# Patient Record
Sex: Female | Born: 2000 | Race: White | Hispanic: No | Marital: Married
Health system: Midwestern US, Community
[De-identification: ages and names within clinical notes are randomized; demographics above are authoritative.]

## PROBLEM LIST (undated history)

## (undated) DIAGNOSIS — L309 Dermatitis, unspecified: Secondary | ICD-10-CM

## (undated) DIAGNOSIS — L509 Urticaria, unspecified: Secondary | ICD-10-CM

## (undated) HISTORY — DX: Dermatitis, unspecified: L30.9

## (undated) HISTORY — DX: Urticaria, unspecified: L50.9

---
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
## 2019-02-10 ENCOUNTER — Emergency Department (HOSPITAL_BASED_OUTPATIENT_CLINIC_OR_DEPARTMENT_OTHER)
Admission: EM | Admit: 2019-02-10 | Discharge: 2019-02-10 | Disposition: A | Discharge status: Home / Self Care | Payer: 59 | Attending: Emergency Medicine | Admitting: Emergency Medicine

## 2019-02-10 ENCOUNTER — Other Ambulatory Visit: Payer: Self-pay

## 2019-02-10 ENCOUNTER — Encounter (HOSPITAL_BASED_OUTPATIENT_CLINIC_OR_DEPARTMENT_OTHER): Payer: Self-pay | Admitting: Emergency Medicine

## 2019-02-10 ENCOUNTER — Emergency Department (HOSPITAL_BASED_OUTPATIENT_CLINIC_OR_DEPARTMENT_OTHER): Payer: 59

## 2019-02-10 DIAGNOSIS — R112 Nausea with vomiting, unspecified: Secondary | ICD-10-CM | POA: Insufficient documentation

## 2019-02-10 DIAGNOSIS — R1084 Generalized abdominal pain: Secondary | ICD-10-CM | POA: Insufficient documentation

## 2019-02-10 DIAGNOSIS — R197 Diarrhea, unspecified: Secondary | ICD-10-CM | POA: Diagnosis not present

## 2019-02-10 DIAGNOSIS — R109 Unspecified abdominal pain: Secondary | ICD-10-CM | POA: Diagnosis present

## 2019-02-10 LAB — COMPREHENSIVE METABOLIC PANEL
ALBUMIN: 4.5 g/dL (ref 3.5–5.0)
ALK PHOS: 73 U/L (ref 38–126)
ALT: 16 U/L (ref 0–44)
ANION GAP: 10 (ref 5–15)
AST: 21 U/L (ref 15–41)
BUN: 19 mg/dL (ref 6–20)
CALCIUM: 9.6 mg/dL (ref 8.9–10.3)
CO2: 22 mmol/L (ref 22–32)
Chloride: 103 mmol/L (ref 98–111)
Creatinine, Ser: 0.67 mg/dL (ref 0.44–1.00)
GFR calc Af Amer: >60 mL/min (ref >60)
GFR calc non Af Amer: >60 mL/min (ref >60)
GLUCOSE: 144 mg/dL — AB (ref 70–99)
POTASSIUM: 3.8 mmol/L (ref 3.5–5.1)
SODIUM: 135 mmol/L (ref 135–145)
Total Bilirubin: 0.7 mg/dL (ref 0.3–1.2)
Total Protein: 8.2 g/dL — ABNORMAL HIGH (ref 6.5–8.1)

## 2019-02-10 LAB — URINALYSIS, ROUTINE W REFLEX MICROSCOPIC
Bilirubin Urine: NEGATIVE
GLUCOSE, UA: NEGATIVE mg/dL
HGB URINE DIPSTICK: NEGATIVE
Ketones, ur: 15 mg/dL — AB
Leukocytes,Ua: NEGATIVE
Nitrite: NEGATIVE
Protein, ur: NEGATIVE mg/dL
SPECIFIC GRAVITY, URINE: 1.01 (ref 1.005–1.030)
pH: >9 — ABNORMAL HIGH (ref 5.0–8.0)

## 2019-02-10 LAB — CBC
HCT: 41.9 % (ref 36.0–46.0)
HEMOGLOBIN: 13.5 g/dL (ref 12.0–15.0)
MCH: 29 pg (ref 26.0–34.0)
MCHC: 32.2 g/dL (ref 30.0–36.0)
MCV: 89.9 fL (ref 80.0–100.0)
Platelets: 265 10*3/uL (ref 150–400)
RBC: 4.66 MIL/uL (ref 3.87–5.11)
RDW: 12 % (ref 11.5–15.5)
WBC: 11.6 10*3/uL — ABNORMAL HIGH (ref 4.0–10.5)
nRBC: 0 % (ref 0.0–0.2)

## 2019-02-10 LAB — LIPASE, BLOOD: Lipase: 32 U/L (ref 11–51)

## 2019-02-10 LAB — PREGNANCY, URINE: Preg Test, Ur: NEGATIVE

## 2019-02-10 MED ORDER — ONDANSETRON HCL 4 MG/2ML IJ SOLN
4.0000 mg | Freq: Once | INTRAMUSCULAR | Status: AC
  Administered 2019-02-10: 4 mg via INTRAVENOUS
  Filled 2019-02-10: qty 2

## 2019-02-10 MED ORDER — ONDANSETRON HCL 4 MG/2ML IJ SOLN
INTRAMUSCULAR | Status: AC
  Administered 2019-02-10: 4 mg via INTRAVENOUS
  Filled 2019-02-10: qty 2

## 2019-02-10 MED ORDER — IOPAMIDOL (ISOVUE-300) INJECTION 61%
100.0000 mL | Freq: Once | INTRAVENOUS | Status: AC | PRN
  Administered 2019-02-10: 100 mL via INTRAVENOUS

## 2019-02-10 MED ORDER — ONDANSETRON HCL 4 MG/2ML IJ SOLN
4.0000 mg | Freq: Once | INTRAMUSCULAR | Status: AC | PRN
  Administered 2019-02-10: 4 mg via INTRAVENOUS

## 2019-02-10 MED ORDER — SODIUM CHLORIDE 0.9 % IV BOLUS
1000.0000 mL | Freq: Once | INTRAVENOUS | Status: AC
  Administered 2019-02-10: 1000 mL via INTRAVENOUS

## 2019-02-10 MED ORDER — LACTATED RINGERS IV BOLUS
1000.0000 mL | Freq: Once | INTRAVENOUS | Status: AC
  Administered 2019-02-10: 1000 mL via INTRAVENOUS

## 2019-02-10 MED ORDER — IOPAMIDOL (ISOVUE-300) INJECTION 61%
30.0000 mL | Freq: Once | INTRAVENOUS | Status: AC | PRN
  Administered 2019-02-10: 15 mL via ORAL

## 2019-02-10 MED ORDER — KETOROLAC TROMETHAMINE 15 MG/ML IJ SOLN
15.0000 mg | Freq: Once | INTRAMUSCULAR | Status: AC
  Administered 2019-02-10: 15 mg via INTRAVENOUS
  Filled 2019-02-10: qty 1

## 2019-02-10 MED ORDER — ONDANSETRON 4 MG PO TBDP: 4.0000 mg | ORAL_TABLET | Freq: Three times a day (TID) | ORAL | 0 refills | Status: AC | PRN

## 2019-02-10 MED FILL — ONDANSETRON ODT 4 MG TABLET: 4 | 3 days supply | Qty: 8 | Fill #0

## 2019-02-10 NOTE — ED Provider Notes (Signed)
[facility] EMERGENCY DEPARTMENT Provider Note   CSN: [id number] Arrival date & time: 02/10/19  0762    History   Chief Complaint Chief Complaint  Patient presents with  . Emesis    HPI Rachel Robinson is a 18 y.o. female.     The history is provided by the patient. No language interpreter was used.  Emesis   Rachel Robinson is a 18 y.o. female who presents to the Emergency Department complaining of vomiting abdominal pain. She presents to the emergency department for evaluation of vomiting, diarrhea and abdominal pain that began around midnight. She reports numerous episodes of vomiting, diarrhea with generalized abdominal pain, greatest over her epigastric. On Friday she had similar episode and symptoms that resolved without intervention. She was feeling well and between episodes. No known bad food exposures. 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 She denies any fevers, dysuria, vaginal discharge. She takes no medications and has no medical problems. She did have an IUD placed one month ago. She has experienced light vaginal bleeding since IUD placement. She is not currently sexually active. History reviewed. No pertinent past medical history.  There are no active problems to display for this patient.   History reviewed. No pertinent surgical history.   OB History   No obstetric history on file.      Home Medications    Prior to Admission medications   Medication Sig Start Date End Date Taking? Authorizing Provider  ondansetron (ZOFRAN ODT) 4 MG disintegrating tablet Take 1 tablet (4 mg total) by mouth every 8 (eight) hours as needed for nausea or vomiting. 02/10/19   Tilden Fossa, MD    Family History No family history on file.  Social History Social History   Tobacco Use  . Smoking status: Never Smoker  . Smokeless tobacco: Never Used  Substance Use Topics  . Alcohol use: No  . Drug use: Not on file     Allergies     Keflet [cephalexin]   Review of Systems Review of Systems  Gastrointestinal: Positive for vomiting.  All other systems reviewed and are negative.    Physical Exam Updated Vital Signs BP 106/60   Pulse 99   Temp 99.3 F (37.4 C) (Oral)   Resp 16   Ht 5\' 1"  (1.549 m)   Wt 56.7 kg   SpO2 99%   BMI 23.62 kg/m   Physical Exam Vitals signs and nursing note reviewed.  Constitutional:      Appearance: She is well-developed.  HENT:     Head: Normocephalic and atraumatic.  Cardiovascular:     Rate and Rhythm: Regular rhythm.     Heart sounds: No murmur.     Comments: Tachycardic Pulmonary:     Effort: Pulmonary effort is normal. No respiratory distress.     Breath sounds: Normal breath sounds.  Abdominal:     Palpations: Abdomen is soft.     Tenderness: There is no guarding or rebound.     Comments: Moderate generalized abdominal tenderness.  Musculoskeletal:        General: No tenderness.  Skin:    General: Skin is warm and dry.  Neurological:     Mental Status: She is alert and oriented to person, place, and time.  Psychiatric:        Behavior: Behavior normal.      ED Treatments / Results  Labs (all labs ordered are listed, but only abnormal results are displayed) Labs Reviewed  COMPREHENSIVE METABOLIC PANEL - Abnormal; Notable for the following components:      Result Value   Glucose, Bld 144 (*)    Total Protein 8.2 (*)    All other components within normal limits  CBC - Abnormal; Notable for the following components:   WBC 11.6 (*)    All other components within normal limits  URINALYSIS, ROUTINE W REFLEX MICROSCOPIC - Abnormal; Notable for the following components:   pH >9.0 (*)    Ketones, ur 15 (*)    All other components within normal limits  LIPASE, BLOOD  PREGNANCY, URINE    EKG None  Radiology Ct Abdomen Pelvis W Contrast  Result Date: 02/10/2019 CLINICAL DATA:  Generalized abdominal pain. EXAM: CT ABDOMEN AND PELVIS WITH CONTRAST  TECHNIQUE: Multidetector CT imaging of the abdomen and pelvis was performed using the standard protocol following bolus administration of intravenous contrast. CONTRAST:  63mL ISOVUE-300 IOPAMIDOL (ISOVUE-300) INJECTION 61% orally, ISOVUE-300 IOPAMIDOL (ISOVUE-300) INJECTION 61% intravenously COMPARISON:  None. FINDINGS: Lower chest: No acute abnormality. Hepatobiliary: No focal liver abnormality is seen. No gallstones, gallbladder wall thickening, or biliary dilatation. Pancreas: Unremarkable. No pancreatic ductal dilatation or surrounding inflammatory changes. Spleen: Normal in size without focal abnormality. Adrenals/Urinary Tract: Adrenal glands appear normal. Left renal cysts are noted. No hydronephrosis or renal obstruction is noted. No renal or ureteral calculi are noted. Urinary bladder is unremarkable. Stomach/Bowel: Stomach is within normal limits. Appendix appears normal. No evidence of bowel wall thickening, distention, or inflammatory changes. Vascular/Lymphatic: No significant vascular findings are present. No enlarged abdominal or pelvic lymph nodes. Reproductive: Intrauterine device is noted. No adnexal abnormality is noted. Other: No abdominal wall hernia or abnormality. No abdominopelvic ascites. Musculoskeletal: No acute or significant osseous findings. IMPRESSION: No acute abnormality seen in the abdomen or pelvis. Electronically Signed   By: Lupita Raider, M.D.   On: 02/10/2019 09:35    Procedures Procedures (including critical care time)  Medications Ordered in ED Medications  ondansetron (ZOFRAN) injection 4 mg (4 mg Intravenous Given 02/10/19 0704)  sodium chloride 0.9 % bolus 1,000 mL ( Intravenous Stopped 02/10/19 0828)  iopamidol (ISOVUE-300) 61 % injection 100 mL (100 mLs Intravenous Contrast Given 02/10/19 0855)  iopamidol (ISOVUE-300) 61 % injection 30 mL (15 mLs Oral Contrast Given 02/10/19 0855)  ketorolac (TORADOL) 15 MG/ML injection 15 mg (15 mg Intravenous Given  02/10/19 0951)  ondansetron (ZOFRAN) injection 4 mg (4 mg Intravenous Given 02/10/19 0952)  lactated ringers bolus 1,000 mL (0 mLs Intravenous Stopped 02/10/19 1215)     Initial Impression / Assessment and Plan / ED Course  I have reviewed the triage vital signs and the nursing notes.  Pertinent labs & imaging results that were available during my care of the patient were reviewed by me and considered in my medical decision making (see chart for details).        Patient here for evaluation of abdominal pain, vomiting, is have mild tenderness on examination without peritoneal findings. Following treatment with IV fluids and antiemetics her nausea has resolved but she does have ongoing abdominal tenderness, greatest over the right lower quadrant. CT abdomen and pelvis was obtained, which was negative for acute appendicitis. She was treated with additional antiemetics as well as Toradol for pain. On repeat assessment she is feeling significantly improved and vomiting has resolved. Discussed with patient home care for vomiting and diarrhea, possible gastroenteritis. Discussed outpatient follow-up as well as return precautions.  Final Clinical Impressions(s) / ED Diagnoses  Final diagnoses:  Nausea vomiting and diarrhea  Generalized abdominal pain    ED Discharge Orders         Ordered    ondansetron (ZOFRAN ODT) 4 MG disintegrating tablet  Every 8 hours PRN     02/10/19 1044           Tilden Fossa, MD 02/10/19 1515

## 2019-02-10 NOTE — ED Notes (Signed)
Ambulated pt by wheelchair to RR

## 2019-02-10 NOTE — ED Triage Notes (Signed)
Pt with vomiting and diarrhea since midnight. Pt had same symptoms x 3 days ago with improvement then returning last night.

## 2019-11-03 IMAGING — CT CT ABD-PELV W/ CM
2 of 4 series · 16 of 46 positions shown, 18 images · IV contrast (iopamidol)
Comparison: None.

CLINICAL DATA: Generalized abdominal pain.

EXAM:
CT ABDOMEN AND PELVIS WITH CONTRAST
TECHNIQUE: Multidetector CT imaging of the abdomen and pelvis was performed
using the standard protocol following bolus administration of
intravenous contrast.
CONTRAST:  15mL QLSJ0M-455 IOPAMIDOL (QLSJ0M-455) INJECTION 61%
orally, 100mL QLSJ0M-455 IOPAMIDOL (QLSJ0M-455) INJECTION 61%
intravenously

[Series 2: axial st · axial · 0.76mm/px · z∈[-432,-16]mm · 13 of 91 slices shown, 15 images]
[im 4/91  soft-tissue]
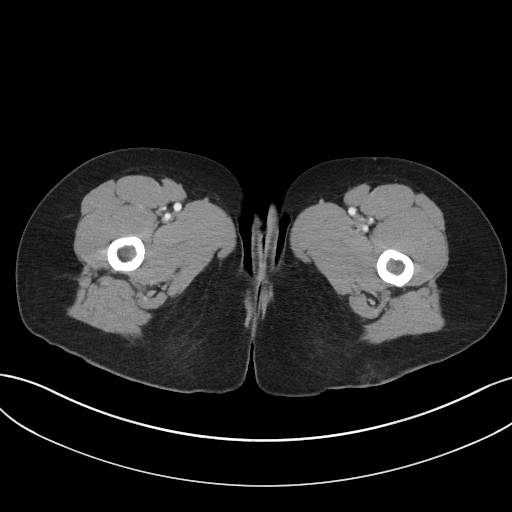
[im 4/91  bone]
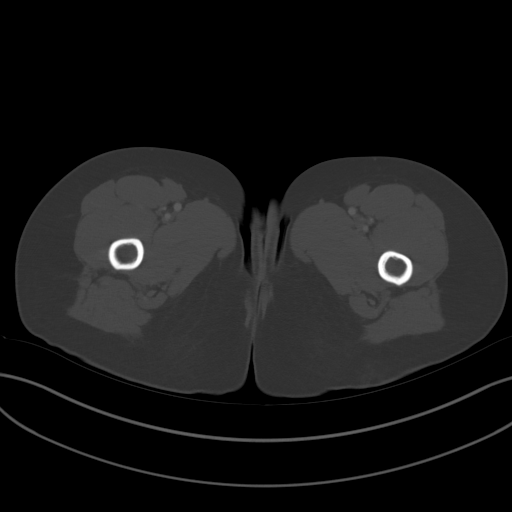
[im 11/91  soft-tissue]
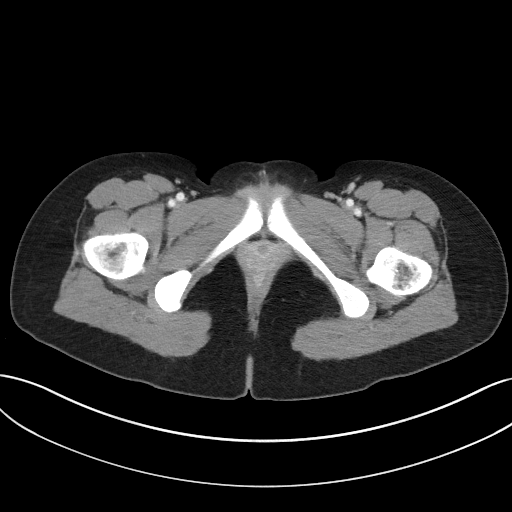
[im 19/91  soft-tissue]
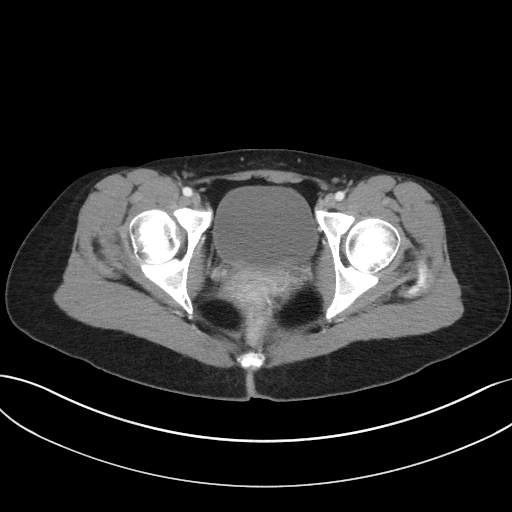
[im 26/91  soft-tissue]
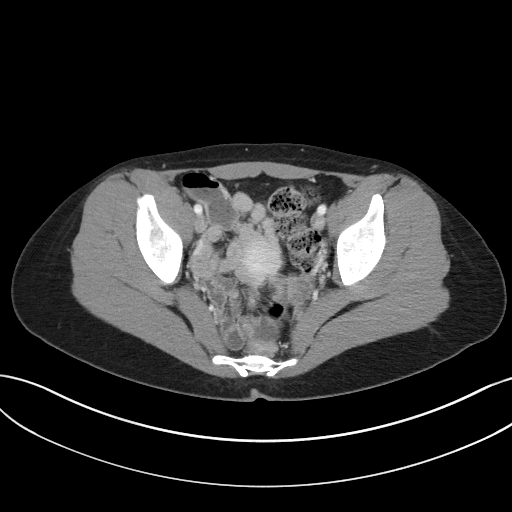
[im 33/91  soft-tissue]
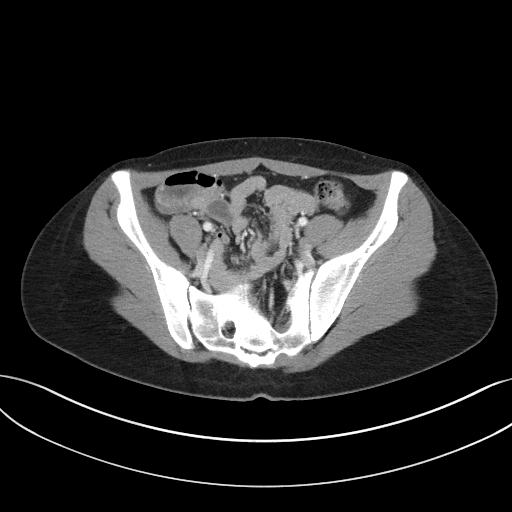
[im 40/91  soft-tissue]
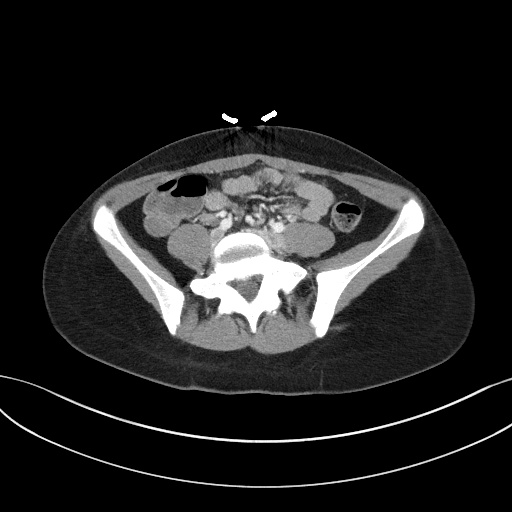
[im 47/91  soft-tissue]
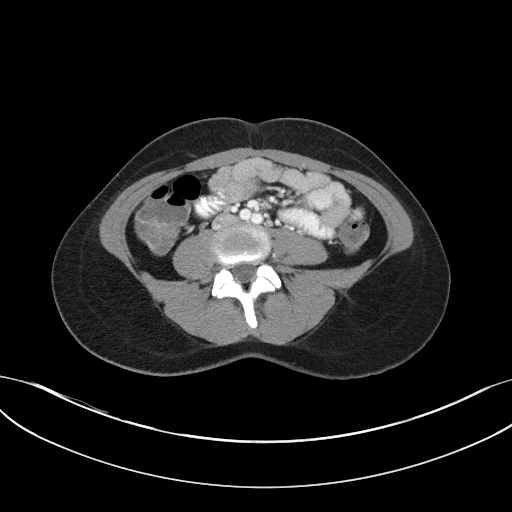
[im 51/91  soft-tissue]
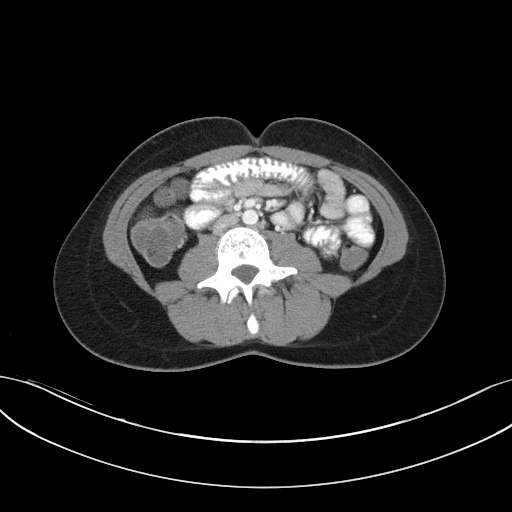
[im 58/91  soft-tissue]
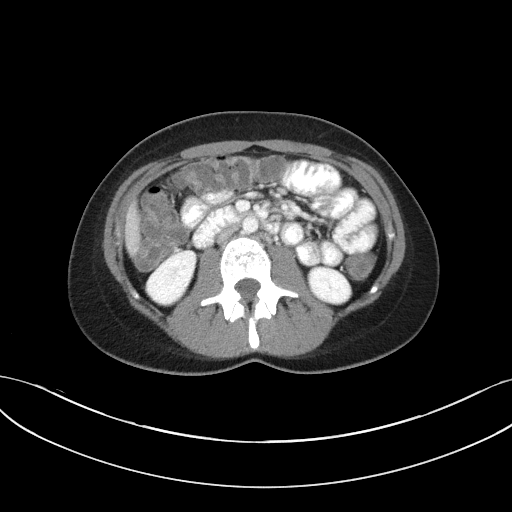
[im 58/91  bone]
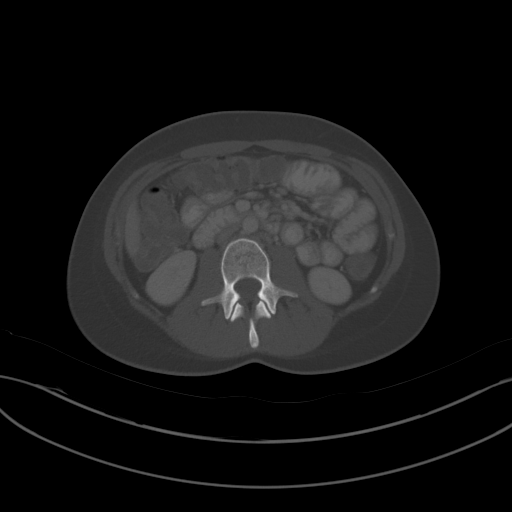
[im 65/91  soft-tissue]
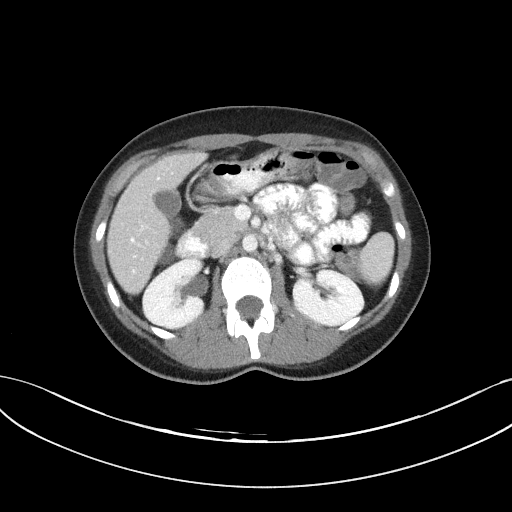
[im 73/91  soft-tissue]
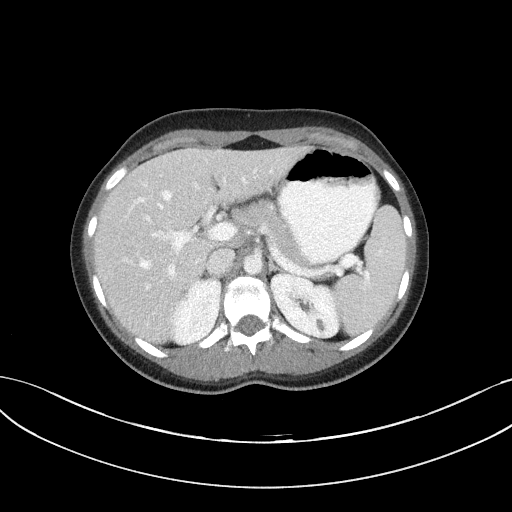
[im 80/91  soft-tissue]
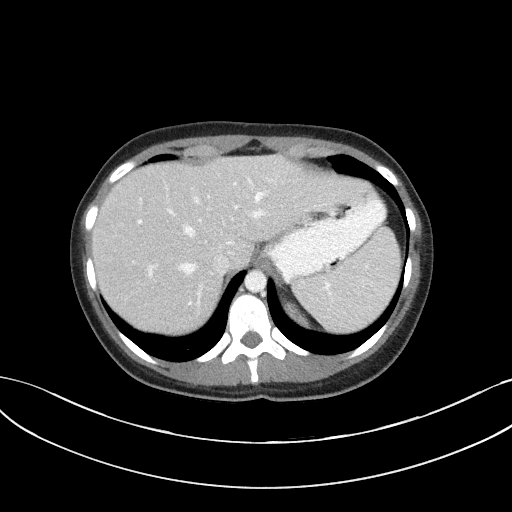
[im 87/91  soft-tissue]
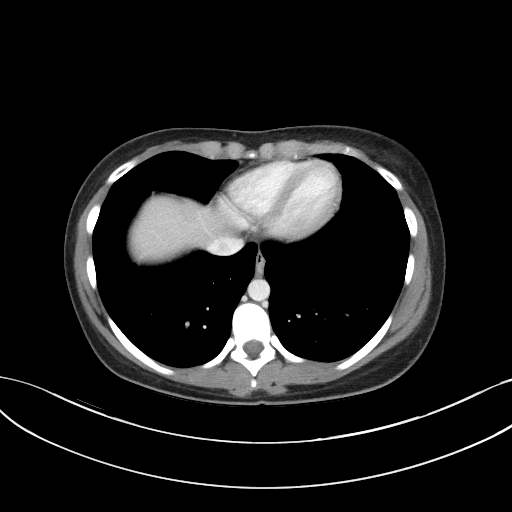

[Series 5: coronal st · coronal · 0.74mm/px · 3 of 78 slices shown]
[im 26/78  soft-tissue]
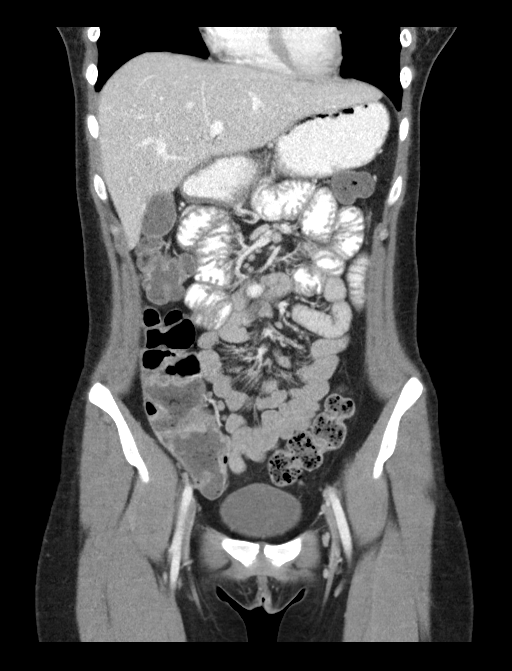
[im 35/78  soft-tissue]
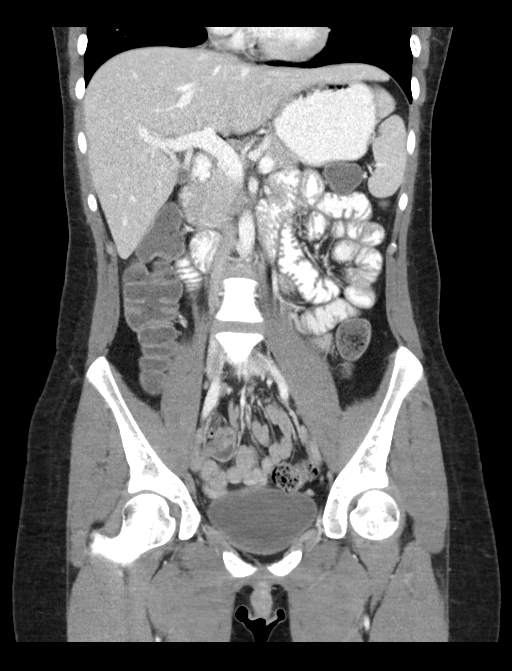
[im 43/78  soft-tissue]
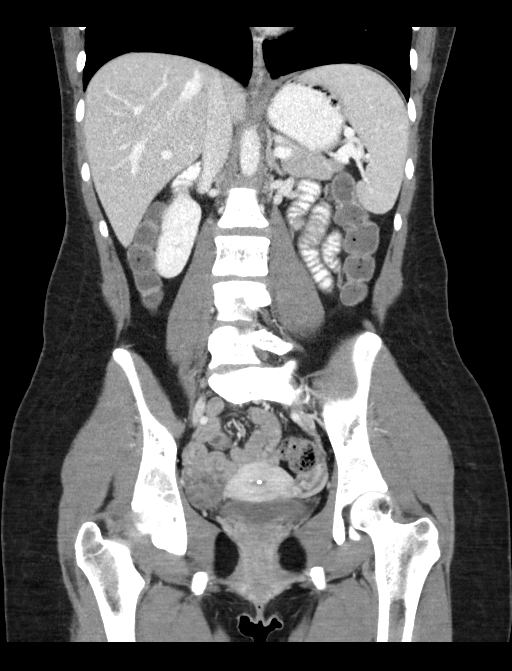

[16 of 46 positions shown; findings below may reference images not displayed]

FINDINGS: Lower chest: No acute abnormality.

Hepatobiliary: No focal liver abnormality is seen. No gallstones,
gallbladder wall thickening, or biliary dilatation.

Pancreas: Unremarkable. No pancreatic ductal dilatation or
surrounding inflammatory changes.

Spleen: Normal in size without focal abnormality.

Adrenals/Urinary Tract: Adrenal glands appear normal. Left renal
cysts are noted. No hydronephrosis or renal obstruction is noted. No
renal or ureteral calculi are noted. Urinary bladder is
unremarkable.

Stomach/Bowel: Stomach is within normal limits. Appendix appears
normal. No evidence of bowel wall thickening, distention, or
inflammatory changes.

Vascular/Lymphatic: No significant vascular findings are present. No
enlarged abdominal or pelvic lymph nodes.

Reproductive: Intrauterine device is noted. No adnexal abnormality
is noted.

Other: No abdominal wall hernia or abnormality. No abdominopelvic
ascites.

Musculoskeletal: No acute or significant osseous findings.
IMPRESSION: No acute abnormality seen in the abdomen or pelvis.

## 2020-05-17 NOTE — Progress Notes (Signed)
New Patient Note  RE: Rachel Robinson MRN: [id number] DOB: [birth year] Date of Office Visit: 05/18/2020  Referring provider: Gillian Scarce, MD Primary care provider: Gillian Scarce, MD  Chief Complaint: Allergy Testing (having random reactions, throat really itchy, hands red and itchy)  History of Present Illness: I had the pleasure of seeing Rachel Robinson for initial evaluation at the [facility] on 05/18/2020. She is a 19 y.o. female, who is self-referred here for the evaluation of allergies.  Rash/itching:  Itching/rash started about 1-2 months ago. Mainly occurs on her hands, feet. Describes them as erythematous, pruritic. Individual rashes lasts about 1-2 hours but resolves quicker with benadryl. No ecchymosis upon resolution. Associated symptoms include: throat itching sometimes. Suspected triggers are unknown. This usually occurs about 1-2 times per week.   Denies any fevers, chills, foods, personal care products or recent infections. Started on Accutane about 3 months ago. She has tried the following therapies: benadryl with good benefit.  Previous work up includes: none. Previous history of rash/hives: no.  Rhinitis: She reports symptoms of nasal congestion, sneezing, itchy eyes. Symptoms have been going on for 3 years. The symptoms are present during the spring months. Other triggers include exposure to pollen. Anosmia: no. Headache: yes. She has used zyrtec, Flonase, OTC eye drops with fair improvement in symptoms. Sinus infections: no. Previous work up includes: no. Previous ENT evaluation: not recently. Last eye exam: 2 years ago. History of reflux: no.  Assessment and Plan: Rachel Robinson is a 19 y.o. female with: Pruritic rash Pruritic rash mainly on her hands and feet for the past 1-2 months. Resolve within 1-2 hours and quicker with benadryl. Occurs 1-2 times per week and noted that it happens more often when she forgets to take her daily  antihistamines. Denies any changes in diet, personal care products or recent infections. Started on Accutane about 3 months ago. History of eczema.   Today's skin testing showed: Positive to grass, dust mites, ragweed, weed, trees, cat. Borderline to soy and wheat - this is most likely irrelevant sensitization as she has no symptoms after consumption.   Her grass allergy showed a robust sensitivity and about 1-2 months ago is when grass pollen started to pollinate.   Start environmental control measures as below.  May use over the counter antihistamines such as Zyrtec (cetirizine), Claritin (loratadine), Allegra (fexofenadine), or Xyzal (levocetirizine) daily as needed. May take twice a day if needed. Samples given.   If you notice worsening symptoms despite taking the allergy medications then let me know. We may have to get some additional bloodwork at that time.   Take pictures of the rash.   Discussed proper skin care.   Other allergic rhinitis Rhino conjunctivitis symptoms for the past 3 years with worsening in the spring. Takes zyrtec, Flonase and OTC eye drops with good benefit. No prior allergy testing.  Today's skin testing showed: Positive to grass, dust mites, ragweed, weed, trees, cat.  Start environmental control measures as below.  May use over the counter antihistamines such as Zyrtec (cetirizine), Claritin (loratadine), Allegra (fexofenadine), or Xyzal (levocetirizine) daily as needed. May take twice a day if needed. Samples given.   May use over the counter eye drops as needed for itchy/watery eyes.  May use Flonase 1 spray per nostril twice a day as needed for nasal symptoms.   Read about allergy injections.  Allergic conjunctivitis of both eyes  See assessment and plan as above for allergic rhinitis.  Declines prescription eye drops and will continue use OTC eye drops as needed.   Return in about 3 months (around 08/18/2020).  Other allergy screening: Asthma:  no Food allergy: no  Dietary History: patient has been eating other foods including milk, eggs, peanut, treenuts, sesame, shellfish, seafood, soy, wheat, meats, fruits and vegetables.  Medication allergy: no  Keflex - nausea and vomiting Hymenoptera allergy: no Eczema:yes History of recurrent infections suggestive of immunodeficency: no  Diagnostics: Skin Testing: Environmental allergy panel and select foods. Positive to grass, dust mites, ragweed, weed, trees, cat. Borderline to soy and wheat.  Results discussed with patient/family. Airborne Adult Perc - 05/18/20 1513    Time Antigen Placed  0300    Allergen Manufacturer  Waynette Buttery    Location  Back    Number of Test  59    Panel 1  Select    1. Control-Buffer 50% Glycerol  Negative    2. Control-Histamine 1 mg/ml  2+    3. Albumin saline  Negative    4. Bahia  4+    5. [place]  4+    6. Johnson  4+    7. Kentucky Blue  4+    8. Meadow Fescue  2+    9. Perennial Rye  3+    10. Sweet Vernal  2+    11. Timothy  2+    12. Cocklebur  Negative    13. Burweed Marshelder  Negative    14. Ragweed, short  Negative    15. Ragweed, Giant  Negative    16. Plantain,  English  Negative    17. Lamb's Quarters  Negative    18. Sheep Sorrell  Negative    19. Rough Pigweed  Negative    20. Marsh Elder, Rough  Negative    21. Mugwort, Common  Negative    22. Ash mix  Negative    23. Birch mix  Negative    24. Beech American  Negative    25. Box, Elder  Negative    26. Cedar, red  Negative    27. Cottonwood, Guinea-Bissau  Negative    28. Elm mix  Negative    29. Hickory  Negative    30. Maple mix  Negative    31. Oak, Guinea-Bissau mix  Negative    32. Pecan Pollen  Negative    33. Pine mix  Negative    34. Sycamore Eastern  Negative    35. Walnut, Black Pollen  Negative    36. Alternaria alternata  Negative    37. Cladosporium Herbarum  Negative    38. Aspergillus mix  Negative    39. Penicillium mix  Negative    40. Bipolaris sorokiniana  (Helminthosporium)  Negative    41. Drechslera spicifera (Curvularia)  Negative    42. Mucor plumbeus  Negative    43. Fusarium moniliforme  Negative    44. Aureobasidium pullulans (pullulara)  Negative    45. Rhizopus oryzae  Negative    46. Botrytis cinera  Negative    47. Epicoccum nigrum  Negative    48. Phoma betae  Negative    49. Candida Albicans  Negative    50. Trichophyton mentagrophytes  Negative    51. Mite, D Farinae  5,000 AU/ml  Negative    52. Mite, D Pteronyssinus  5,000 AU/ml  2+    53. Cat Hair 10,000 BAU/ml  Negative    54.  Dog Epithelia  Negative    55. Mixed  Feathers  Negative    56. Horse Epithelia  Negative    57. Cockroach, German  Negative    58. Mouse  Negative    59. Tobacco Leaf  Negative     Intradermal - 05/18/20 1514    Time Antigen Placed  1514    Allergen Manufacturer  Lavella Hammock    Location  Arm    Number of Test  11    Intradermal  Select    Control  Negative    Ragweed mix  3+    Weed mix  3+    Tree mix  2+    Mold 1  Negative    Mold 2  Negative    Mold 3  Negative    Mold 4  Negative    Cat  2+    Dog  Negative    Cockroach  Negative     Food Adult Perc - 05/18/20 1500    Time Antigen Placed  0300    Allergen Manufacturer  Greer    Location  Back    Number of allergen test  10    1. Peanut  Negative    2. Soybean  --   +/-   3. Wheat  --   +/-   4. Sesame  Negative    5. Milk, cow  Negative    6. Egg White, Chicken  Negative    7. Casein  Negative    8. Shellfish Mix  Negative    9. Fish Mix  Negative    10. Cashew  Negative       Past Medical History: Patient Active Problem List   Diagnosis Date Noted  . Pruritic rash 05/18/2020  . Other allergic rhinitis 05/18/2020  . Allergic conjunctivitis of both eyes 05/18/2020   Past Medical History:  Diagnosis Date  . Eczema   . Urticaria    Past Surgical History: History reviewed. No pertinent surgical history. Medication List:  Current Outpatient Medications    Medication Sig Dispense Refill  . FLUoxetine (PROZAC) 10 MG capsule Take by mouth.    . ISOtretinoin (ACCUTANE) 40 MG capsule Take 40 mg by mouth 2 (two) times daily.    . ondansetron (ZOFRAN ODT) 4 MG disintegrating tablet Take 1 tablet (4 mg total) by mouth every 8 (eight) hours as needed for nausea or vomiting. 8 tablet 0   No current facility-administered medications for this visit.   Allergies: Allergies  Allergen Reactions  . Keflet [Cephalexin] Nausea And Vomiting   Social History: Social History   Socioeconomic History  . Marital status: Single    Spouse name: Not on file  . Number of children: Not on file  . Years of education: Not on file  . Highest education level: Not on file  Occupational History  . Not on file  Tobacco Use  . Smoking status: Never Smoker  . Smokeless tobacco: Never Used  Substance and Sexual Activity  . Alcohol use: No  . Drug use: Never  . Sexual activity: Not on file  Other Topics Concern  . Not on file  Social History Narrative  . Not on file   Social Determinants of Health   Financial Resource Strain:   . Difficulty of Paying Living Expenses:   Food Insecurity:   . Worried About Charity fundraiser in the Last Year:   . Arboriculturist in the Last Year:   Transportation Needs:   . Film/video editor (Medical):   Marland Kitchen  Lack of Transportation (Non-Medical):   Physical Activity:   . Days of Exercise per Week:   . Minutes of Exercise per Session:   Stress:   . Feeling of Stress :   Social Connections:   . Frequency of Communication with Friends and Family:   . Frequency of Social Gatherings with Friends and Family:   . Attends Religious Services:   . Active Member of Clubs or Organizations:   . Attends Banker Meetings:   Marland Kitchen Marital Status:    Lives in a house built in 2003. Smoking: denies Occupation: Hydrographic surveyor HistorySurveyor, minerals in the house: no Engineer, civil (consulting) in the family room:  no Carpet in the bedroom: yes Heating: electric Cooling: central Pet: yes 3 dogs x 15 yrs, 6 yrs, 2 yrs  Family History: Family History  Problem Relation Age of Onset  . Allergic rhinitis Mother   . Allergic rhinitis Father   . Asthma Neg Hx   . Eczema Neg Hx   . Urticaria Neg Hx    Review of Systems  Constitutional: Negative for appetite change, chills, fever and unexpected weight change.  HENT: Negative for congestion and rhinorrhea.   Eyes: Negative for itching.  Respiratory: Negative for cough, chest tightness, shortness of breath and wheezing.   Cardiovascular: Negative for chest pain.  Gastrointestinal: Negative for abdominal pain.  Genitourinary: Negative for difficulty urinating.  Skin: Positive for rash.  Allergic/Immunologic: Positive for environmental allergies.  Neurological: Positive for headaches.   Objective: BP 118/78 (BP Location: Left Arm, Patient Position: Sitting, Cuff Size: Normal)   Pulse 84   Temp 97.6 F (36.4 C) (Temporal)   Resp 16   Ht 5' 2.56" (1.589 m)   Wt 133 lb (60.3 kg)   SpO2 98%   BMI 23.89 kg/m  Body mass index is 23.89 kg/m. Physical Exam  Constitutional: She is oriented to person, place, and time. She appears well-developed and well-nourished.  HENT:  Head: Normocephalic and atraumatic.  Right Ear: External ear normal.  Left Ear: External ear normal.  Nose: Nose normal.  Mouth/Throat: Oropharynx is clear and moist.  Eyes: Conjunctivae and EOM are normal.  Cardiovascular: Normal rate, regular rhythm and normal heart sounds. Exam reveals no gallop and no friction rub.  No murmur heard. Pulmonary/Chest: Effort normal and breath sounds normal. She has no wheezes. She has no rales.  Abdominal: Soft.  Musculoskeletal:     Cervical back: Neck supple.  Neurological: She is alert and oriented to person, place, and time.  Skin: Skin is warm and dry. No rash noted.  Few dry patches on left upper extremity  Psychiatric: She has a  normal mood and affect. Her behavior is normal.  Nursing note and vitals reviewed.  The plan was reviewed with the patient/family, and all questions/concerned were addressed.  It was my pleasure to see Rachel Robinson today and participate in her care. Please feel free to contact me with any questions or concerns.  Sincerely,  Wyline Mood, DO Allergy & Immunology  [facility] office: [phone] [facility] office: [phone] [place] office: [phone]

## 2020-05-18 ENCOUNTER — Other Ambulatory Visit: Payer: Self-pay

## 2020-05-18 ENCOUNTER — Encounter: Payer: Self-pay | Admitting: Allergy

## 2020-05-18 ENCOUNTER — Ambulatory Visit (INDEPENDENT_AMBULATORY_CARE_PROVIDER_SITE_OTHER): Payer: 59 | Admitting: Allergy

## 2020-05-18 VITALS — BP 118/78 | HR 84 | Temp 97.6°F | Resp 16 | Ht 62.56 in | Wt 133.0 lb

## 2020-05-18 DIAGNOSIS — J3089 Other allergic rhinitis: Secondary | ICD-10-CM | POA: Diagnosis not present

## 2020-05-18 DIAGNOSIS — L282 Other prurigo: Secondary | ICD-10-CM

## 2020-05-18 DIAGNOSIS — H1013 Acute atopic conjunctivitis, bilateral: Secondary | ICD-10-CM | POA: Diagnosis not present

## 2020-05-18 NOTE — Assessment & Plan Note (Signed)
Rhino conjunctivitis symptoms for the past 3 years with worsening in the spring. Takes zyrtec, Flonase and OTC eye drops with good benefit. No prior allergy testing.  Today's skin testing showed: Positive to grass, dust mites, ragweed, weed, trees, cat.  Start environmental control measures as below.  May use over the counter antihistamines such as Zyrtec (cetirizine), Claritin (loratadine), Allegra (fexofenadine), or Xyzal (levocetirizine) daily as needed. May take twice a day if needed. Samples given.   May use over the counter eye drops as needed for itchy/watery eyes.  May use Flonase 1 spray per nostril twice a day as needed for nasal symptoms.   Read about allergy injections.

## 2020-05-18 NOTE — Assessment & Plan Note (Signed)
·   See assessment and plan as above for allergic rhinitis.  Declines prescription eye drops and will continue use OTC eye drops as needed.

## 2020-05-18 NOTE — Assessment & Plan Note (Addendum)
Pruritic rash mainly on her hands and feet for the past 1-2 months. Resolve within 1-2 hours and quicker with benadryl. Occurs 1-2 times per week and noted that it happens more often when she forgets to take her daily antihistamines. Denies any changes in diet, personal care products or recent infections. Started on Accutane about 3 months ago. History of eczema.   Today's skin testing showed: Positive to grass, dust mites, ragweed, weed, trees, cat. Borderline to soy and wheat - this is most likely irrelevant sensitization as she has no symptoms after consumption.   Her grass allergy showed a robust sensitivity and about 1-2 months ago is when grass pollen started to pollinate.   Start environmental control measures as below.  May use over the counter antihistamines such as Zyrtec (cetirizine), Claritin (loratadine), Allegra (fexofenadine), or Xyzal (levocetirizine) daily as needed. May take twice a day if needed. Samples given.   If you notice worsening symptoms despite taking the allergy medications then let me know. We may have to get some additional bloodwork at that time.   Take pictures of the rash.   Discussed proper skin care.

## 2020-05-18 NOTE — Patient Instructions (Addendum)
Today's skin testing showed: Positive to grass, dust mites, ragweed, weed, trees, cat. Borderline to soy and wheat.  Results given.   Start environmental control measures as below.  May use over the counter antihistamines such as Zyrtec (cetirizine), Claritin (loratadine), Allegra (fexofenadine), or Xyzal (levocetirizine) daily as needed. May take twice a day if needed. Samples given.   May use over the counter eye drops as needed for itchy/watery eyes.  May use Flonase 1 spray per nostril twice a day as needed for nasal symptoms.   Read about allergy injections.  If you notice worsening symptoms despite taking the allergy medications then let me know. We may have to get some additional bloodwork at that time.   Take pictures of the rash.   Follow up in 3 months or sooner if needed.   Reducing Pollen Exposure . Pollen seasons: trees (spring), grass (summer) and ragweed/weeds (fall). Marland Kitchen Keep windows closed in your home and car to lower pollen exposure.  Lilian Kapur air conditioning in the bedroom and throughout the house if possible.  . Avoid going out in dry windy days - especially early morning. . Pollen counts are highest between 5 - 10 AM and on dry, hot and windy days.  . Save outside activities for late afternoon or after a heavy rain, when pollen levels are lower.  . Avoid mowing of grass if you have grass pollen allergy. Marland Kitchen Be aware that pollen can also be transported indoors on people and pets.  . Dry your clothes in an automatic dryer rather than hanging them outside where they might collect pollen.  . Rinse hair and eyes before bedtime. Control of House Dust Mite Allergen . Dust mite allergens are a common trigger of allergy and asthma symptoms. While they can be found throughout the house, these microscopic creatures thrive in warm, humid environments such as bedding, upholstered furniture and carpeting. . Because so much time is spent in the bedroom, it is essential to  reduce mite levels there.  . Encase pillows, mattresses, and box springs in special allergen-proof fabric covers or airtight, zippered plastic covers.  . Bedding should be washed weekly in hot water (130 F) and dried in a hot dryer. Allergen-proof covers are available for comforters and pillows that can't be regularly washed.  Reyes Ivan the allergy-proof covers every few months. Minimize clutter in the bedroom. Keep pets out of the bedroom.  Marland Kitchen Keep humidity less than 50% by using a dehumidifier or air conditioning. You can buy a humidity measuring device called a hygrometer to monitor this.  . If possible, replace carpets with hardwood, linoleum, or washable area rugs. If that's not possible, vacuum frequently with a vacuum that has a HEPA filter. . Remove all upholstered furniture and non-washable window drapes from the bedroom. . Remove all non-washable stuffed toys from the bedroom.  Wash stuffed toys weekly. Pet Allergen Avoidance: . Contrary to popular opinion, there are no "hypoallergenic" breeds of dogs or cats. That is because people are not allergic to an animal's hair, but to an allergen found in the animal's saliva, dander (dead skin flakes) or urine. Pet allergy symptoms typically occur within minutes. For some people, symptoms can build up and become most severe 8 to 12 hours after contact with the animal. People with severe allergies can experience reactions in public places if dander has been transported on the pet owners' clothing. Marland Kitchen Keeping an animal outdoors is only a partial solution, since homes with pets in the yard still  have higher concentrations of animal allergens. . Before getting a pet, ask your allergist to determine if you are allergic to animals. If your pet is already considered part of your family, try to minimize contact and keep the pet out of the bedroom and other rooms where you spend a great deal of time. . As with dust mites, vacuum carpets often or replace carpet with  a hardwood floor, tile or linoleum. . High-efficiency particulate air (HEPA) cleaners can reduce allergen levels over time. . While dander and saliva are the source of cat and dog allergens, urine is the source of allergens from rabbits, hamsters, mice and [place] pigs; so ask a non-allergic family member to clean the animal's cage. . If you have a pet allergy, talk to your allergist about the potential for allergy immunotherapy (allergy shots). This strategy can often provide long-term relief.   Skin care recommendations  Bath time: . Always use lukewarm water. AVOID very hot or cold water. Marland Kitchen Keep bathing time to 5-10 minutes. . Do NOT use bubble bath. . Use a mild soap and use just enough to wash the dirty areas. . Do NOT scrub skin vigorously.  . After bathing, pat dry your skin with a towel. Do NOT rub or scrub the skin.  Moisturizers and prescriptions:  . ALWAYS apply moisturizers immediately after bathing (within 3 minutes). This helps to lock-in moisture. . Use the moisturizer several times a day over the whole body. Kermit Balo summer moisturizers include: Aveeno, CeraVe, Cetaphil. Kermit Balo winter moisturizers include: Aquaphor, Vaseline, Cerave, Cetaphil, Eucerin, Vanicream. . When using moisturizers along with medications, the moisturizer should be applied about one hour after applying the medication to prevent diluting effect of the medication or moisturize around where you applied the medications. When not using medications, the moisturizer can be continued twice daily as maintenance.  Laundry and clothing: . Avoid laundry products with added color or perfumes. . Use unscented hypo-allergenic laundry products such as Tide free, Cheer free & gentle, and All free and clear.  . If the skin still seems dry or sensitive, you can try double-rinsing the clothes. . Avoid tight or scratchy clothing such as wool. . Do not use fabric softeners or dyer sheets.

## 2020-08-22 ENCOUNTER — Ambulatory Visit: Payer: 59 | Admitting: Allergy

## 2020-08-22 DIAGNOSIS — J302 Other seasonal allergic rhinitis: Secondary | ICD-10-CM | POA: Insufficient documentation

## 2020-08-22 NOTE — Progress Notes (Deleted)
Follow Up Note  RE: Rachel Robinson MRN: [id number] DOB: 28-Jan-2001 Date of Office Visit: 08/22/2020  Referring provider: Gillian Scarce, MD Primary care provider: Gillian Scarce, MD  Chief Complaint: No chief complaint on file.  History of Present Illness: I had the pleasure of seeing [facility] for a follow up visit at the [facility] on 08/22/2020. She is a 19 y.o. female, who is being followed for pruritic rash and allergic rhinoconjunctivitis. Her previous allergy office visit was on 05/18/2020 with Dr. Selena Batten. Today is a regular follow up visit.  Pruritic rash Pruritic rash mainly on her hands and feet for the past 1-2 months. Resolve within 1-2 hours and quicker with benadryl. Occurs 1-2 times per week and noted that it happens more often when she forgets to take her daily antihistamines. Denies any changes in diet, personal care products or recent infections. Started on Accutane about 3 months ago. History of eczema.   Today's skin testing showed: Positive to grass, dust mites, ragweed, weed, trees, cat. Borderline to soy and wheat - this is most likely irrelevant sensitization as she has no symptoms after consumption.   Her grass allergy showed a robust sensitivity and about 1-2 months ago is when grass pollen started to pollinate.   Start environmental control measures as below.  May use over the counter antihistamines such as Zyrtec (cetirizine), Claritin (loratadine), Allegra (fexofenadine), or Xyzal (levocetirizine) daily as needed. May take twice a day if needed. Samples given.   If you notice worsening symptoms despite taking the allergy medications then let me know. We may have to get some additional bloodwork at that time.   Take pictures of the rash.   Discussed proper skin care.   Other allergic rhinitis Rhino conjunctivitis symptoms for the past 3 years with worsening in the spring. Takes zyrtec, Flonase and OTC eye drops with good benefit. No  prior allergy testing.  Today's skin testing showed: Positive to grass, dust mites, ragweed, weed, trees, cat.  Start environmental control measures as below.  May use over the counter antihistamines such as Zyrtec (cetirizine), Claritin (loratadine), Allegra (fexofenadine), or Xyzal (levocetirizine) daily as needed. May take twice a day if needed. Samples given.   May use over the counter eye drops as needed for itchy/watery eyes.  May use Flonase 1 spray per nostril twice a day as needed for nasal symptoms.   Read about allergy injections.  Allergic conjunctivitis of both eyes  See assessment and plan as above for allergic rhinitis.  Declines prescription eye drops and will continue use OTC eye drops as needed.   Return in about 3 months (around 08/18/2020).  Assessment and Plan: Leeloo is a 19 y.o. female with: No problem-specific Assessment & Plan notes found for this encounter.  No follow-ups on file.  No orders of the defined types were placed in this encounter.  Lab Orders  No laboratory test(s) ordered today    Diagnostics: Spirometry:  Tracings reviewed. Her effort: {Blank single:19197::"Good reproducible efforts.","It was hard to get consistent efforts and there is a question as to whether this reflects a maximal maneuver.","Poor effort, data can not be interpreted."} FVC: ***L FEV1: ***L, ***% predicted FEV1/FVC ratio: ***% Interpretation: {Blank single:19197::"Spirometry consistent with mild obstructive disease","Spirometry consistent with moderate obstructive disease","Spirometry consistent with severe obstructive disease","Spirometry consistent with possible restrictive disease","Spirometry consistent with mixed obstructive and restrictive disease","Spirometry uninterpretable due to technique","Spirometry consistent with normal pattern","No overt abnormalities noted given today's efforts"}.  Please  see scanned spirometry results for details.  Skin Testing:  {Blank single:19197::"Select foods","Environmental allergy panel","Environmental allergy panel and select foods","Food allergy panel","None","Deferred due to recent antihistamines use"}. Positive test to: ***. Negative test to: ***.  Results discussed with patient/family.   Medication List:  Current Outpatient Medications  Medication Sig Dispense Refill  . FLUoxetine (PROZAC) 10 MG capsule Take by mouth.    . ISOtretinoin (ACCUTANE) 40 MG capsule Take 40 mg by mouth 2 (two) times daily.    . ondansetron (ZOFRAN ODT) 4 MG disintegrating tablet Take 1 tablet (4 mg total) by mouth every 8 (eight) hours as needed for nausea or vomiting. 8 tablet 0   No current facility-administered medications for this visit.   Allergies: Allergies  Allergen Reactions  . Keflet [Cephalexin] Nausea And Vomiting   I reviewed her past medical history, social history, family history, and environmental history and no significant changes have been reported from her previous visit.  Review of Systems  Constitutional: Negative for appetite change, chills, fever and unexpected weight change.  HENT: Negative for congestion and rhinorrhea.   Eyes: Negative for itching.  Respiratory: Negative for cough, chest tightness, shortness of breath and wheezing.   Cardiovascular: Negative for chest pain.  Gastrointestinal: Negative for abdominal pain.  Genitourinary: Negative for difficulty urinating.  Skin: Positive for rash.  Allergic/Immunologic: Positive for environmental allergies.  Neurological: Positive for headaches.   Objective: There were no vitals taken for this visit. There is no height or weight on file to calculate BMI. Physical Exam Vitals and nursing note reviewed.  Constitutional:      Appearance: Normal appearance. She is well-developed.  HENT:     Head: Normocephalic and atraumatic.     Right Ear: External ear normal.     Left Ear: External ear normal.     Nose: Nose normal.     Mouth/Throat:       Mouth: Mucous membranes are moist.     Pharynx: Oropharynx is clear.  Eyes:     Conjunctiva/sclera: Conjunctivae normal.  Cardiovascular:     Rate and Rhythm: Normal rate and regular rhythm.     Heart sounds: Normal heart sounds. No murmur heard.  No friction rub. No gallop.   Pulmonary:     Effort: Pulmonary effort is normal.     Breath sounds: Normal breath sounds. No wheezing or rales.  Musculoskeletal:     Cervical back: Neck supple.  Skin:    General: Skin is warm and dry.     Findings: No rash.     Comments: Few dry patches on left upper extremity  Neurological:     Mental Status: She is alert and oriented to person, place, and time.  Psychiatric:        Behavior: Behavior normal.    Previous notes and tests were reviewed. The plan was reviewed with the patient/family, and all questions/concerned were addressed.  It was my pleasure to see Hayleen today and participate in her care. Please feel free to contact me with any questions or concerns.  Sincerely,  Wyline Mood, DO Allergy & Immunology  [facility] office: [phone] [facility] office: [phone] [place] office: [phone]

## 2020-11-07 ENCOUNTER — Ambulatory Visit (INDEPENDENT_AMBULATORY_CARE_PROVIDER_SITE_OTHER): Payer: 59 | Admitting: Allergy

## 2020-11-07 ENCOUNTER — Other Ambulatory Visit: Payer: Self-pay

## 2020-11-07 ENCOUNTER — Encounter: Payer: Self-pay | Admitting: Allergy

## 2020-11-07 VITALS — BP 110/60 | HR 97

## 2020-11-07 DIAGNOSIS — L509 Urticaria, unspecified: Secondary | ICD-10-CM | POA: Insufficient documentation

## 2020-11-07 DIAGNOSIS — J3089 Other allergic rhinitis: Secondary | ICD-10-CM

## 2020-11-07 DIAGNOSIS — H1010 Acute atopic conjunctivitis, unspecified eye: Secondary | ICD-10-CM

## 2020-11-07 DIAGNOSIS — J302 Other seasonal allergic rhinitis: Secondary | ICD-10-CM | POA: Diagnosis not present

## 2020-11-07 NOTE — Assessment & Plan Note (Signed)
Past history - Rhino conjunctivitis symptoms for the past 3 years with worsening in the spring. Takes zyrtec, Flonase and OTC eye drops with good benefit. 2021 skin testing showed: Positive to grass, dust mites, ragweed, weed, trees, cat. Interim history - asymptomatic with no daily medications since no pollen outdoors.   Continue environmental control measures as below.  May use over the counter antihistamines such as Zyrtec (cetirizine), Claritin (loratadine), Allegra (fexofenadine), or Xyzal (levocetirizine) daily as needed. May take twice a day if needed.   May use over the counter eye drops as needed for itchy/watery eyes.  May use Flonase 1 spray per nostril twice a day as needed for nasal symptoms.   If above regimen does not control symptoms then consider starting allergy immunotherapy.

## 2020-11-07 NOTE — Patient Instructions (Addendum)
Hives:  Start zyrtec (cetirizine) 10mg  once a day and may take it twice a day if hives not controlled.   If it makes you too drowsy let know. . Avoid the following potential triggers: alcohol, tight clothing, NSAIDs.  . Get bloodwork:  o We are ordering labs, so please allow 1-2 weeks for the results to come back. o With the newly implemented Cures Act, the labs might be visible to you at the same time that they become visible to me. However, I will not address the results until all of the results are back, so please be patient.   Environmental allergies: 2021 skin testing showed: Positive to grass, dust mites, ragweed, weed, trees, cat.  Continue environmental control measures as below.  May use over the counter antihistamines such as Zyrtec (cetirizine), Claritin (loratadine), Allegra (fexofenadine), or Xyzal (levocetirizine) daily as needed. May take twice a day if needed.   May use over the counter eye drops as needed for itchy/watery eyes.  May use Flonase 1 spray per nostril twice a day as needed for nasal symptoms.   Follow up in 2 months or sooner if needed.   Reducing Pollen Exposure . Pollen seasons: trees (spring), grass (summer) and ragweed/weeds (fall). [birth year]1981 Keep windows closed in your home and car to lower pollen exposure.  Marland Kitchen air conditioning in the bedroom and throughout the house if possible.  . Avoid going out in dry windy days - especially early morning. . Pollen counts are highest between 5 - 10 AM and on dry, hot and windy days.  . Save outside activities for late afternoon or after a heavy rain, when pollen levels are lower.  . Avoid mowing of grass if you have grass pollen allergy. Lilian Kapur Be aware that pollen can also be transported indoors on people and pets.  . Dry your clothes in an automatic dryer rather than hanging them outside where they might collect pollen.  . Rinse hair and eyes before bedtime. Control of House Dust Mite Allergen . Dust mite  allergens are a common trigger of allergy and asthma symptoms. While they can be found throughout the house, these microscopic creatures thrive in warm, humid environments such as bedding, upholstered furniture and carpeting. . Because so much time is spent in the bedroom, it is essential to reduce mite levels there.  . Encase pillows, mattresses, and box springs in special allergen-proof fabric covers or airtight, zippered plastic covers.  . Bedding should be washed weekly in hot water (130 F) and dried in a hot dryer. Allergen-proof covers are available for comforters and pillows that can't be regularly washed.  Marland Kitchen the allergy-proof covers every few months. Minimize clutter in the bedroom. Keep pets out of the bedroom.  Reyes Ivan Keep humidity less than 50% by using a dehumidifier or air conditioning. You can buy a humidity measuring device called a hygrometer to monitor this.  . If possible, replace carpets with hardwood, linoleum, or washable area rugs. If that's not possible, vacuum frequently with a vacuum that has a HEPA filter. . Remove all upholstered furniture and non-washable window drapes from the bedroom. . Remove all non-washable stuffed toys from the bedroom.  Wash stuffed toys weekly. Pet Allergen Avoidance: . Contrary to popular opinion, there are no "hypoallergenic" breeds of dogs or cats. That is because people are not allergic to an animal's hair, but to an allergen found in the animal's saliva, dander (dead skin flakes) or urine. Pet allergy symptoms typically occur within minutes.  For some people, symptoms can build up and become most severe 8 to 12 hours after contact with the animal. People with severe allergies can experience reactions in public places if dander has been transported on the pet owners' clothing. Marland Kitchen Keeping an animal outdoors is only a partial solution, since homes with pets in the yard still have higher concentrations of animal allergens. . Before getting a pet, ask  your allergist to determine if you are allergic to animals. If your pet is already considered part of your family, try to minimize contact and keep the pet out of the bedroom and other rooms where you spend a great deal of time. . As with dust mites, vacuum carpets often or replace carpet with a hardwood floor, tile or linoleum. . High-efficiency particulate air (HEPA) cleaners can reduce allergen levels over time. . While dander and saliva are the source of cat and dog allergens, urine is the source of allergens from rabbits, hamsters, mice and [place] pigs; so ask a non-allergic family member to clean the animal's cage. . If you have a pet allergy, talk to your allergist about the potential for allergy immunotherapy (allergy shots). This strategy can often provide long-term relief.   Skin care recommendations  Bath time: . Always use lukewarm water. AVOID very hot or cold water. Marland Kitchen Keep bathing time to 5-10 minutes. . Do NOT use bubble bath. . Use a mild soap and use just enough to wash the dirty areas. . Do NOT scrub skin vigorously.  . After bathing, pat dry your skin with a towel. Do NOT rub or scrub the skin.  Moisturizers and prescriptions:  . ALWAYS apply moisturizers immediately after bathing (within 3 minutes). This helps to lock-in moisture. . Use the moisturizer several times a day over the whole body. Peri Jefferson summer moisturizers include: Aveeno, CeraVe, Cetaphil. Peri Jefferson winter moisturizers include: Aquaphor, Vaseline, Cerave, Cetaphil, Eucerin, Vanicream. . When using moisturizers along with medications, the moisturizer should be applied about one hour after applying the medication to prevent diluting effect of the medication or moisturize around where you applied the medications. When not using medications, the moisturizer can be continued twice daily as maintenance.  Laundry and clothing: . Avoid laundry products with added color or perfumes. . Use unscented hypo-allergenic  laundry products such as Tide free, Cheer free & gentle, and All free and clear.  . If the skin still seems dry or sensitive, you can try double-rinsing the clothes. . Avoid tight or scratchy clothing such as wool. . Do not use fabric softeners or dyer sheets.

## 2020-11-07 NOTE — Assessment & Plan Note (Signed)
Persistent hives and breaking out 3-4 times per week since the summer. No triggers noted. Takes benadryl as needed with good benefit. Not on daily antihistamines.  . Based on clinical history, she likely has chronic idiopathic urticaria. Discussed with patient, that urticaria is usually caused by release of histamine by cutaneous mast cells but sometimes it is non-histamine mediated. Explained that urticaria is not always associated with allergies. In most cases, the exact etiology for urticaria can not be established and it is considered idiopathic.  Start zyrtec (cetirizine) 10mg  once a day and may take it twice a day if hives not controlled.   If it makes you too drowsy let know. . Avoid the following potential triggers: alcohol, tight clothing, NSAIDs.  . Get bloodwork to rule out other etiologies.

## 2020-11-07 NOTE — Progress Notes (Signed)
Follow Up Note  RE: Rachel Robinson MRN: [id number] DOB: 11-Aug-2001 Date of Office Visit: 11/07/2020  Referring provider: Gillian Scarce, MD Primary care provider: Gillian Scarce, MD  Chief Complaint: Rash  History of Present Illness: I had the pleasure of seeing Rachel Robinson for a follow up visit at the [facility] on 11/07/2020. She is a 19 y.o. female, who is being followed for allergic rhinoconjunctivitis and pruritic rash. Her previous allergy office visit was on 05/18/2020 with Dr. Selena Batten. Today is a regular follow up visit.  Environmental allergies Patient is still having itchy skin/itchy eyes at times and would take a benadryl as needed with good benefit.  Hives  Now patient is having worse symptoms with hives Patient broke out in hives where the skin was exposed to the air while in the hot tub.  She had similar rash around the arms and stomach where it was air exposed after hiking.   Breaks out in hives about 3-4 times per week since the summer. No specific triggers noted. Last episode was last Friday.  Usually takes a benadryl with good benefit.  Denies any changes in diet, medications, personal care products or recent infections.   Patient is not taking zyrtec daily anymore.   She is concerned about alpha gal allergy as her father had it but she hasn't noticed worse hives after eating red meat.  Reviewed images on the phone which were consistent with urticarial rash.  Assessment and Plan: Rachel Robinson is a 19 y.o. female with: Urticaria Persistent hives and breaking out 3-4 times per week since the summer. No triggers noted. Takes benadryl as needed with good benefit. Not on daily antihistamines.  . Based on clinical history, she likely has chronic idiopathic urticaria. Discussed with patient, that urticaria is usually caused by release of histamine by cutaneous mast cells but sometimes it is non-histamine mediated. Explained that urticaria is not  always associated with allergies. In most cases, the exact etiology for urticaria can not be established and it is considered idiopathic.  Start zyrtec (cetirizine) 10mg  once a day and may take it twice a day if hives not controlled.   If it makes you too drowsy let know. . Avoid the following potential triggers: alcohol, tight clothing, NSAIDs.  . Get bloodwork to rule out other etiologies.  Seasonal and perennial allergic rhinoconjunctivitis Past history - Rhino conjunctivitis symptoms for the past 3 years with worsening in the spring. Takes zyrtec, Flonase and OTC eye drops with good benefit. 2021 skin testing showed: Positive to grass, dust mites, ragweed, weed, trees, cat. Interim history - asymptomatic with no daily medications since no pollen outdoors.   Continue environmental control measures as below.  May use over the counter antihistamines such as Zyrtec (cetirizine), Claritin (loratadine), Allegra (fexofenadine), or Xyzal (levocetirizine) daily as needed. May take twice a day if needed.   May use over the counter eye drops as needed for itchy/watery eyes.  May use Flonase 1 spray per nostril twice a day as needed for nasal symptoms.   If above regimen does not control symptoms then consider starting allergy immunotherapy.  Return in about 2 months (around 01/07/2021).  Lab Orders     Alpha-Gal Panel     CBC with Differential/Platelet     Chronic Urticaria     Comprehensive metabolic panel     Tryptase     Thyroid Cascade Profile     ANA w/Reflex     Sedimentation  rate     C-reactive protein     C3 and C4  Diagnostics: None.  Medication List:  Current Outpatient Medications  Medication Sig Dispense Refill  . Amphetamine ER (ADZENYS XR-ODT) 9.4 MG TBED Take by mouth.    Marland Kitchen FLUoxetine (PROZAC) 10 MG capsule Take by mouth.    . ISOtretinoin (ACCUTANE) 40 MG capsule Take 40 mg by mouth 2 (two) times daily.    . ondansetron (ZOFRAN ODT) 4 MG disintegrating tablet  Take 1 tablet (4 mg total) by mouth every 8 (eight) hours as needed for nausea or vomiting. 8 tablet 0   No current facility-administered medications for this visit.   Allergies: Allergies  Allergen Reactions  . Keflet [Cephalexin] Nausea And Vomiting   I reviewed her past medical history, social history, family history, and environmental history and no significant changes have been reported from her previous visit.  Review of Systems  Constitutional: Negative for appetite change, chills, fever and unexpected weight change.  HENT: Negative for congestion and rhinorrhea.   Eyes: Negative for itching.  Respiratory: Negative for cough, chest tightness, shortness of breath and wheezing.   Cardiovascular: Negative for chest pain.  Gastrointestinal: Negative for abdominal pain.  Genitourinary: Negative for difficulty urinating.  Skin: Positive for rash.  Allergic/Immunologic: Positive for environmental allergies.  Neurological: Negative for headaches.   Objective: BP 110/60   Pulse 97   SpO2 100%  There is no height or weight on file to calculate BMI. Physical Exam Vitals and nursing note reviewed.  Constitutional:      Appearance: Normal appearance. She is well-developed.  HENT:     Head: Normocephalic and atraumatic.     Right Ear: Tympanic membrane and external ear normal.     Left Ear: Tympanic membrane and external ear normal.     Nose: Nose normal.     Mouth/Throat:     Mouth: Mucous membranes are moist.     Pharynx: Oropharynx is clear.  Eyes:     Conjunctiva/sclera: Conjunctivae normal.  Cardiovascular:     Rate and Rhythm: Normal rate and regular rhythm.     Heart sounds: Normal heart sounds. No murmur heard.  No friction rub. No gallop.   Pulmonary:     Effort: Pulmonary effort is normal.     Breath sounds: Normal breath sounds. No wheezing or rales.  Musculoskeletal:     Cervical back: Neck supple.  Skin:    General: Skin is warm.     Findings: No rash.    Neurological:     Mental Status: She is alert and oriented to person, place, and time.  Psychiatric:        Behavior: Behavior normal.    Previous notes and tests were reviewed. The plan was reviewed with the patient/family, and all questions/concerned were addressed.  It was my pleasure to see Rachel Robinson today and participate in her care. Please feel free to contact me with any questions or concerns.  Sincerely,  Wyline Mood, DO Allergy & Immunology  [facility] office: [phone] [facility] office: [phone]

## 2020-12-06 ENCOUNTER — Other Ambulatory Visit (HOSPITAL_COMMUNITY): Payer: Self-pay | Admitting: Pulmonary Disease

## 2020-12-08 ENCOUNTER — Telehealth (HOSPITAL_COMMUNITY): Payer: Self-pay | Admitting: Family

## 2020-12-08 NOTE — Telephone Encounter (Signed)
Called to discuss with Damian Leavell about Covid symptoms and the use of casirivimab/imdevimab, a combination monoclonal antibody infusion for those with mild to moderate Covid symptoms and at a high risk of hospitalization.     Pt is qualified for this infusion at the infusion center due to co-morbid conditions and/or a member of an at-risk group, however declines infusion at this time as she is feeling better and symptoms started 12/04/20. Symptoms tier reviewed as well as criteria for ending isolation.  Symptoms reviewed that would warrant ED/Hospital evaluation. Preventative practices reviewed. Patient verbalized understanding. Patient advised to go to Urgent care or ED with severe symptoms.      Patient Active Problem List   Diagnosis Date Noted  . Urticaria 11/07/2020  . Seasonal and perennial allergic rhinoconjunctivitis 08/22/2020    Keamber Macfadden,NP

## 2023-06-21 DIAGNOSIS — O219 Vomiting of pregnancy, unspecified: Secondary | ICD-10-CM

## 2023-06-21 NOTE — Discharge Instructions (Signed)
You were seen in the emergency department for vomiting possibly with blood.  The results of your tests were reassuring.   If your abdominal pain continues, you can take Pepcid or Maalox to help with this.  Please follow-up with your OB/GYN or return to the emergency department if you experience a worsening of symptoms or any new symptoms that are concerning to you.

## 2023-06-21 NOTE — ED Triage Notes (Addendum)
Pt reports vomiting about ago with a coffee-like color. Pt is [redacted]wks pregnant. Denies any lower abd cramps or bleeding. Worried about the color of the vomitus.

## 2023-06-21 NOTE — ED Notes (Signed)
Discharge instructions on medications, follow up care and symptom management discussed with patient. Opportunity for questions was given and questions answered.

## 2023-06-21 NOTE — ED Provider Notes (Signed)
[facility]  EMERGENCY DEPARTMENT ENCOUNTER      Pt Name: Elaine Walker  MRN: [id number]  Birthdate 2001/01/09  Date of evaluation: 06/21/2023  Provider: Unice Cobble, MD    CHIEF COMPLAINT       Chief Complaint   Patient presents with    Emesis During Pregnancy         HISTORY OF PRESENT ILLNESS   (Location/Symptom, Timing/Onset, Context/Setting, Quality, Duration, Modifying Factors, Severity)  Note limiting factors.   22 year old [redacted] weeks pregnant female with no pertinent medical history presents to the emergency department complaining of 1 episode of dark-colored vomit approximately 45 minutes prior to arrival.  States that it appeared to have a coffee ground-like color.  She reports some mild upper abdominal pain.  She denies any lower abd cramps or bleeding.  She is worried about the color of the vomitus.       The history is provided by the patient.         Review of External Medical Records:     Nursing Notes were reviewed.    REVIEW OF SYSTEMS    (2-9 systems for level 4, 10 or more for level 5)     Review of Systems   Constitutional: Negative.    HENT: Negative.     Eyes: Negative.    Respiratory: Negative.     Cardiovascular: Negative.    Gastrointestinal:  Positive for vomiting.   Genitourinary: Negative.    Musculoskeletal: Negative.    Skin: Negative.    Neurological: Negative.    Psychiatric/Behavioral: Negative.         Except as noted above the remainder of the review of systems was reviewed and negative.       PAST MEDICAL HISTORY   No past medical history on file.      SURGICAL HISTORY     No past surgical history on file.      CURRENT MEDICATIONS       Previous Medications    No medications on file       ALLERGIES     Ciprofloxacin and Keflex [cephalexin]    FAMILY HISTORY     No family history on file.       SOCIAL HISTORY       Social History     Socioeconomic History    Marital status: Single           PHYSICAL EXAM    (up to 7 for level 4, 8 or more for level 5)     ED Triage Vitals  [06/21/23 2215]   BP Temp Temp Source Pulse Respirations SpO2 Height Weight - Scale   120/89 98.6 F (37 C) Oral 90 18 98 % -- 62.8 kg (138 lb 7.2 oz)       There is no height or weight on file to calculate BMI.    Physical Exam  Vitals and nursing note reviewed.   Constitutional:       General: She is not in acute distress.     Appearance: Normal appearance. She is not ill-appearing.   HENT:      Head: Normocephalic and atraumatic.      Nose: Nose normal.      Mouth/Throat:      Mouth: Mucous membranes are moist.   Eyes:      Pupils: Pupils are equal, round, and reactive to light.   Cardiovascular:      Rate and Rhythm: Normal rate and regular rhythm.  Pulmonary:      Effort: Pulmonary effort is normal. No respiratory distress.      Breath sounds: Normal breath sounds.   Abdominal:      General: There is no distension.      Palpations: Abdomen is soft.      Tenderness: There is no abdominal tenderness.   Musculoskeletal:      Cervical back: Normal range of motion.   Skin:     General: Skin is warm and dry.   Neurological:      General: No focal deficit present.      Mental Status: She is alert and oriented to person, place, and time.   Psychiatric:         Mood and Affect: Mood normal.         DIAGNOSTIC RESULTS     EKG: All EKG's are interpreted by the Emergency Department Physician who either signs or Co-signs this chart in the absence of a cardiologist.        RADIOLOGY:   Non-plain film images such as CT, Ultrasound and MRI are read by the radiologist. Plain radiographic images are visualized and preliminarily interpreted by the emergency physician with the below findings:        Interpretation per the Radiologist below, if available at the time of this note:    No orders to display        LABS:  Labs Reviewed   CBC WITH AUTO DIFFERENTIAL - Abnormal; Notable for the following components:       Result Value    RBC 3.78 (*)     Hematocrit 33.9 (*)     Immature Granulocytes % 1 (*)     Immature Granulocytes  Absolute 0.1 (*)     All other components within normal limits   COMPREHENSIVE METABOLIC PANEL - Abnormal; Notable for the following components:    Creatinine 0.48 (*)     ALT 10 (*)     AST 11 (*)     Alk Phosphatase 43 (*)     Albumin/Globulin Ratio 0.9 (*)     All other components within normal limits   PROTIME-INR       All other labs were within normal range or not returned as of this dictation.    EMERGENCY DEPARTMENT COURSE and DIFFERENTIAL DIAGNOSIS/MDM:   Vitals:    Vitals:    06/21/23 2215   BP: 120/89   Pulse: 90   Resp: 18   Temp: 98.6 F (37 C)   TempSrc: Oral   SpO2: 98%   Weight: 62.8 kg (138 lb 7.2 oz)           Medical Decision Making  DDx: Nausea of pregnancy, Mallory-Weiss tear, gastritis, peptic ulcer    Plan:  - Labs: CBC, CMP, PT/INR    Reassessment: Patient's work-up is reassuring.  They report improvement of their symptoms.  Patient may have some microtrauma to the stomach or esophagus that caused coffee-ground looking emesis but her labs are very reassuring, vital signs are normal, she is currently asymptomatic and very well-appearing.  Will discharge at this time with recommendation to follow-up with their OB/GYN and to return to the emergency department for worsening symptoms or new symptoms that are concerning to them.  Also advised her that she could take Pepcid or Maalox if her epigastric pain continues.          Amount and/or Complexity of Data Reviewed  Independent Historian: spouse  Labs: ordered.  REASSESSMENT            CONSULTS:  None    PROCEDURES:  Unless otherwise noted below, none     Procedures      FINAL IMPRESSION      1. Vomiting affecting pregnancy          DISPOSITION/PLAN   DISPOSITION Decision To Discharge 06/21/2023 11:27:31 PM      PATIENT REFERRED TO:  Your OBGYN    Go to   As planned      DISCHARGE MEDICATIONS:  New Prescriptions    No medications on file         (Please note that portions of this note were completed with a voice recognition program.   Efforts were made to edit the dictations but occasionally words are mis-transcribed.)    Unice Cobble, MD (electronically signed)  Emergency Attending Physician / Physician Assistant / Nurse Practitioner            Unice Cobble, MD  06/21/23 [phone]

## 2023-06-22 ENCOUNTER — Inpatient Hospital Stay
Admit: 2023-06-22 | Discharge: 2023-06-22 | Disposition: A | Discharge status: Home / Self Care | Payer: PRIVATE HEALTH INSURANCE | Attending: Emergency Medicine

## 2023-06-22 LAB — CBC WITH AUTO DIFFERENTIAL
Basophils %: 0 % (ref 0–1)
Basophils Absolute: 0 10*3/uL (ref 0.0–0.1)
Eosinophils %: 1 % (ref 0–7)
Eosinophils Absolute: 0.1 10*3/uL (ref 0.0–0.4)
Hematocrit: 33.9 % — ABNORMAL LOW (ref 35.0–47.0)
Hemoglobin: 11.6 g/dL (ref 11.5–16.0)
Immature Granulocytes %: 1 % — ABNORMAL HIGH (ref 0.0–0.5)
Immature Granulocytes Absolute: 0.1 10*3/uL — ABNORMAL HIGH (ref 0.00–0.04)
Lymphocytes %: 26 % (ref 12–49)
Lymphocytes Absolute: 1.8 10*3/uL (ref 0.8–3.5)
MCH: 30.7 PG (ref 26.0–34.0)
MCHC: 34.2 g/dL (ref 30.0–36.5)
MCV: 89.7 FL (ref 80.0–99.0)
MPV: 10.6 FL (ref 8.9–12.9)
Monocytes %: 8 % (ref 5–13)
Monocytes Absolute: 0.5 10*3/uL (ref 0.0–1.0)
Neutrophils %: 64 % (ref 32–75)
Neutrophils Absolute: 4.3 10*3/uL (ref 1.8–8.0)
Nucleated RBCs: 0 PER 100 WBC
Platelets: 227 10*3/uL (ref 150–400)
RBC: 3.78 M/uL — ABNORMAL LOW (ref 3.80–5.20)
RDW: 11.5 % (ref 11.5–14.5)
WBC: 6.7 10*3/uL (ref 3.6–11.0)
nRBC: 0 10*3/uL (ref 0.00–0.01)

## 2023-06-22 LAB — COMPREHENSIVE METABOLIC PANEL
ALT: 10 U/L — ABNORMAL LOW (ref 12–78)
AST: 11 U/L — ABNORMAL LOW (ref 15–37)
Albumin/Globulin Ratio: 0.9 — ABNORMAL LOW (ref 1.1–2.2)
Albumin: 3.6 g/dL (ref 3.5–5.0)
Alk Phosphatase: 43 U/L — ABNORMAL LOW (ref 45–117)
Anion Gap: 7 mmol/L (ref 5–15)
BUN/Creatinine Ratio: 13 (ref 12–20)
BUN: 6 MG/DL (ref 6–20)
CO2: 29 mmol/L (ref 21–32)
Calcium: 8.8 MG/DL (ref 8.5–10.1)
Chloride: 101 mmol/L (ref 97–108)
Creatinine: 0.48 MG/DL — ABNORMAL LOW (ref 0.55–1.02)
Est, Glom Filt Rate: >90 mL/min/{1.73_m2} (ref >60)
Globulin: 3.8 g/dL (ref 2.0–4.0)
Glucose: 84 mg/dL (ref 65–100)
Potassium: 3.8 mmol/L (ref 3.5–5.1)
Sodium: 137 mmol/L (ref 136–145)
Total Bilirubin: 0.3 MG/DL (ref 0.2–1.0)
Total Protein: 7.4 g/dL (ref 6.4–8.2)

## 2023-06-22 LAB — PROTIME-INR
INR: 0.9 (ref 0.9–1.1)
Protime: 9.1 s (ref 9.0–11.1)

## 2024-03-25 ENCOUNTER — Emergency Department (HOSPITAL_BASED_OUTPATIENT_CLINIC_OR_DEPARTMENT_OTHER)
Admission: EM | Admit: 2024-03-25 | Discharge: 2024-03-25 | Disposition: A | Discharge status: Home / Self Care | Attending: Emergency Medicine | Admitting: Emergency Medicine

## 2024-03-25 ENCOUNTER — Other Ambulatory Visit: Payer: Self-pay

## 2024-03-25 ENCOUNTER — Encounter (HOSPITAL_BASED_OUTPATIENT_CLINIC_OR_DEPARTMENT_OTHER): Payer: Self-pay | Admitting: Emergency Medicine

## 2024-03-25 DIAGNOSIS — R110 Nausea: Secondary | ICD-10-CM | POA: Insufficient documentation

## 2024-03-25 DIAGNOSIS — R1010 Upper abdominal pain, unspecified: Secondary | ICD-10-CM | POA: Diagnosis present

## 2024-03-25 LAB — CBC
HCT: 39.4 % (ref 36.0–46.0)
Hemoglobin: 12.8 g/dL (ref 12.0–15.0)
MCH: 28.3 pg (ref 26.0–34.0)
MCHC: 32.5 g/dL (ref 30.0–36.0)
MCV: 87 fL (ref 80.0–100.0)
Platelets: 291 10*3/uL (ref 150–400)
RBC: 4.53 MIL/uL (ref 3.87–5.11)
RDW: 13.2 % (ref 11.5–15.5)
WBC: 7.2 10*3/uL (ref 4.0–10.5)
nRBC: 0 % (ref 0.0–0.2)

## 2024-03-25 LAB — COMPREHENSIVE METABOLIC PANEL WITH GFR
ALT: 14 U/L (ref 0–44)
AST: 17 U/L (ref 15–41)
Albumin: 4.4 g/dL (ref 3.5–5.0)
Alkaline Phosphatase: 77 U/L (ref 38–126)
Anion gap: 7 (ref 5–15)
BUN: 20 mg/dL (ref 6–20)
CO2: 25 mmol/L (ref 22–32)
Calcium: 9.3 mg/dL (ref 8.9–10.3)
Chloride: 106 mmol/L (ref 98–111)
Creatinine, Ser: 0.65 mg/dL (ref 0.44–1.00)
GFR, Estimated: >60 mL/min (ref >60)
Glucose, Bld: 83 mg/dL (ref 70–99)
Potassium: 4.7 mmol/L (ref 3.5–5.1)
Sodium: 138 mmol/L (ref 135–145)
Total Bilirubin: 0.2 mg/dL (ref 0.0–1.2)
Total Protein: 7.6 g/dL (ref 6.5–8.1)

## 2024-03-25 LAB — URINALYSIS, ROUTINE W REFLEX MICROSCOPIC
Bilirubin Urine: NEGATIVE
Glucose, UA: NEGATIVE mg/dL
Hgb urine dipstick: NEGATIVE
Ketones, ur: NEGATIVE mg/dL
Leukocytes,Ua: NEGATIVE
Nitrite: NEGATIVE
Protein, ur: NEGATIVE mg/dL
Specific Gravity, Urine: 1.027 (ref 1.005–1.030)
pH: 5.5 (ref 5.0–8.0)

## 2024-03-25 LAB — PREGNANCY, URINE: Preg Test, Ur: NEGATIVE

## 2024-03-25 LAB — LIPASE, BLOOD: Lipase: 15 U/L (ref 11–51)

## 2024-03-25 NOTE — ED Provider Notes (Signed)
 [facility] AT [facility] Provider Note   CSN: [id number] Arrival date & time: 03/25/24  1356     History  Chief Complaint  Patient presents with   Abdominal Pain    Rachel Robinson is a 23 y.o. female with no significant past medical history presents the ED today for abdominal pain.  Patient reports intermittent cramping sensation to the upper abdomen for the past month.  Patient states that the pain worsened last night and associated with some nausea.  No vomiting dysuria, change vomits.  No fevers at home.  States that she had a C-section 12/2023.  Her C-section site was not warm to touch or red.  Patient was seen by her OB/GYN the past several weeks and they told her it could be normal post pregnancy abdominal pains.  She has been taking ibuprofen for her their recommendation without improvement.  No additional complaints or concerns at this time.    Home Medications Prior to Admission medications   Medication Sig Start Date End Date Taking? Authorizing Provider  Amphetamine ER (ADZENYS XR-ODT) 9.4 MG TBED Take by mouth. 07/20/20   [provider]  FLUoxetine (PROZAC) 10 MG capsule Take by mouth. 04/13/20   [provider]  ISOtretinoin (ACCUTANE) 40 MG capsule Take 40 mg by mouth 2 (two) times daily.    [provider]  ondansetron (ZOFRAN ODT) 4 MG disintegrating tablet Take 1 tablet (4 mg total) by mouth every 8 (eight) hours as needed for nausea or vomiting. 02/10/19   Kelsey Patricia, MD      Allergies    Ciprofloxacin and Keflet [cephalexin]    Review of Systems   Review of Systems  Gastrointestinal:  Positive for abdominal pain.  All other systems reviewed and are negative.   Physical Exam Updated Vital Signs BP 119/85   Pulse 72   Temp 97.8 F (36.6 C)   Resp 16   LMP 02/20/2024   SpO2 100%   Breastfeeding Yes  Physical Exam Vitals and nursing note reviewed.  Constitutional:      General: She is not in  acute distress.    Appearance: Normal appearance.  HENT:     Head: Normocephalic and atraumatic.     Mouth/Throat:     Mouth: Mucous membranes are moist.  Eyes:     Conjunctiva/sclera: Conjunctivae normal.     Pupils: Pupils are equal, round, and reactive to light.  Cardiovascular:     Rate and Rhythm: Normal rate and regular rhythm.     Pulses: Normal pulses.     Heart sounds: Normal heart sounds.  Pulmonary:     Effort: Pulmonary effort is normal.     Breath sounds: Normal breath sounds.  Abdominal:     Palpations: Abdomen is soft.     Tenderness: There is abdominal tenderness. There is no right CVA tenderness or left CVA tenderness.     Comments: Tenderness to palpation of the upper abdomen without guarding or rebound  Musculoskeletal:        General: Normal range of motion.     Cervical back: Normal range of motion.  Skin:    General: Skin is warm and dry.     Findings: No rash.  Neurological:     General: No focal deficit present.     Mental Status: She is alert.  Psychiatric:        Mood and Affect: Mood normal.        Behavior: Behavior normal.  ED Results / Procedures / Treatments   Labs (all labs ordered are listed, but only abnormal results are displayed) Labs Reviewed  LIPASE, BLOOD  COMPREHENSIVE METABOLIC PANEL WITH GFR  CBC  URINALYSIS, ROUTINE W REFLEX MICROSCOPIC  PREGNANCY, URINE    EKG None  Radiology No results found.  Procedures Procedures: not indicated.   Medications Ordered in ED Medications - No data to display  ED Course/ Medical Decision Making/ A&P                                 Medical Decision Making Amount and/or Complexity of Data Reviewed Labs: ordered.   This patient presents to the ED for concern of abdominal pain, this involves an extensive number of treatment options, and is a complaint that carries with it a high risk of complications and morbidity.   Differential diagnosis includes: infection,  gastroenteritis, gastritis, IBS, IBD, pancreatitis, bowel obstruction, perforation, etc.   Comorbidities  See HPI above   Additional History  Additional history obtained from prior records   Lab Tests  I ordered and personally interpreted labs.  The pertinent results include:    CMP, lipase, CBC are unremarkable UA is unremarkable - no signs of infection Pregnancy test is negative   Problem List / ED Course / Critical Interventions / Medication Management  Reports intermittent crampy upper abdominal pain for the past month.  She has been taking ibuprofen without improvement of symptoms.  No fevers, nausea, vomiting, dysuria, vaginal discharge, changes to bowel habits. Patient had a C-section 3 months ago.  Has followed up with her OB/GYN last week and asked them about her upper abdominal pain, they told her to take the ibuprofen and states that the pain could be due to normal physiologic changes that happened to the body after pregnancy.  The pain was more severe last night which prompted patient to come in for further evaluation. Discussed labs with patient. With shared decision making, we decided not to order imaging of the abdomen since the labs were unremarkable and her vitals were reassuring.  Patient is agreeable with this plan.  Advise following up with OB/GYN for further evaluation if pains persist.   Social Determinants of Health  Access to healthcare   Test / Admission - Considered  Patient is hemodynamically stable and safe for discharge home. Return precautions given.       Final Clinical Impression(s) / ED Diagnoses Final diagnoses:  Pain of upper abdomen    Rx / DC Orders ED Discharge Orders     None         Sonnie Dusky, PA-C 03/25/24 1822    Quinn Bucco, DO 03/31/24 1614

## 2024-03-25 NOTE — Discharge Instructions (Addendum)
 As discussed, your labs are unremarkable. There are no acute findings indicating infection. Take ibuprofen 600 mg every 6-8 hours as needed for pain.  Follow-up with your OB/GYN in the next several days for reevaluation.  Get help right away if: You cannot stop vomiting. Your pain is only in one part of your belly, like on the right side. You have bloody or black poop, or poop that looks like tar. You have trouble breathing. You have chest pain.

## 2024-03-25 NOTE — ED Notes (Signed)
 Discharge instructions, pain management, and follow up care with OB/GYN reviewed and explained. Pt verbalized understanding and had no further questions on d/c. Pt caox4, ambulatory, NAD on d/c.

## 2024-03-25 NOTE — ED Triage Notes (Signed)
 Abdominal cramping, comes and goes x 1 month Severe started tonight Denies bleeding. Some nausea, no vomiting  Cesarean in 12/2023
